# Patient Record
Sex: Female | Born: 1935 | Race: White | Hispanic: No | Smoking: Former smoker
Health system: Southern US, Community
[De-identification: ages and names within clinical notes are randomized; demographics above are authoritative.]

## PROBLEM LIST (undated history)

## (undated) DIAGNOSIS — E7800 Pure hypercholesterolemia, unspecified: Secondary | ICD-10-CM

## (undated) DIAGNOSIS — M545 Low back pain, unspecified: Secondary | ICD-10-CM

## (undated) DIAGNOSIS — H3530 Unspecified macular degeneration: Secondary | ICD-10-CM

## (undated) DIAGNOSIS — Z8719 Personal history of other diseases of the digestive system: Secondary | ICD-10-CM

## (undated) DIAGNOSIS — M4800 Spinal stenosis, site unspecified: Secondary | ICD-10-CM

## (undated) DIAGNOSIS — I10 Essential (primary) hypertension: Secondary | ICD-10-CM

## (undated) DIAGNOSIS — M1990 Unspecified osteoarthritis, unspecified site: Secondary | ICD-10-CM

## (undated) DIAGNOSIS — C4490 Unspecified malignant neoplasm of skin, unspecified: Secondary | ICD-10-CM

## (undated) DIAGNOSIS — R011 Cardiac murmur, unspecified: Secondary | ICD-10-CM

## (undated) DIAGNOSIS — J189 Pneumonia, unspecified organism: Secondary | ICD-10-CM

## (undated) DIAGNOSIS — G8929 Other chronic pain: Secondary | ICD-10-CM

## (undated) DIAGNOSIS — E039 Hypothyroidism, unspecified: Secondary | ICD-10-CM

## (undated) HISTORY — PX: FRACTURE SURGERY: SHX138

## (undated) HISTORY — PX: LUMBAR SPINE SURGERY: SHX701

## (undated) HISTORY — PX: ABDOMINAL HYSTERECTOMY: SHX81

## (undated) HISTORY — PX: LAPAROSCOPIC CHOLECYSTECTOMY: SUR755

## (undated) HISTORY — PX: APPENDECTOMY: SHX54

## (undated) HISTORY — PX: CATARACT EXTRACTION W/ INTRAOCULAR LENS  IMPLANT, BILATERAL: SHX1307

## (undated) HISTORY — PX: ROTATOR CUFF REPAIR: SHX139

## (undated) HISTORY — PX: BACK SURGERY: SHX140

---

## 2016-03-05 ENCOUNTER — Encounter (HOSPITAL_COMMUNITY): Payer: Self-pay

## 2016-03-05 ENCOUNTER — Inpatient Hospital Stay (HOSPITAL_COMMUNITY)
Admission: EM | Admit: 2016-03-05 | Discharge: 2016-03-07 | DRG: 202 | Disposition: A | Discharge status: Home / Self Care | Payer: Medicare Other | Attending: Internal Medicine | Admitting: Internal Medicine

## 2016-03-05 ENCOUNTER — Emergency Department (HOSPITAL_COMMUNITY): Payer: Medicare Other

## 2016-03-05 DIAGNOSIS — J210 Acute bronchiolitis due to respiratory syncytial virus: Secondary | ICD-10-CM | POA: Diagnosis not present

## 2016-03-05 DIAGNOSIS — G8929 Other chronic pain: Secondary | ICD-10-CM | POA: Diagnosis present

## 2016-03-05 DIAGNOSIS — E039 Hypothyroidism, unspecified: Secondary | ICD-10-CM | POA: Diagnosis present

## 2016-03-05 DIAGNOSIS — N179 Acute kidney failure, unspecified: Secondary | ICD-10-CM | POA: Diagnosis present

## 2016-03-05 DIAGNOSIS — M4800 Spinal stenosis, site unspecified: Secondary | ICD-10-CM | POA: Diagnosis present

## 2016-03-05 DIAGNOSIS — I10 Essential (primary) hypertension: Secondary | ICD-10-CM | POA: Diagnosis present

## 2016-03-05 DIAGNOSIS — J189 Pneumonia, unspecified organism: Secondary | ICD-10-CM

## 2016-03-05 DIAGNOSIS — H3530 Unspecified macular degeneration: Secondary | ICD-10-CM | POA: Diagnosis present

## 2016-03-05 DIAGNOSIS — D649 Anemia, unspecified: Secondary | ICD-10-CM | POA: Diagnosis present

## 2016-03-05 DIAGNOSIS — R778 Other specified abnormalities of plasma proteins: Secondary | ICD-10-CM | POA: Diagnosis present

## 2016-03-05 DIAGNOSIS — J181 Lobar pneumonia, unspecified organism: Secondary | ICD-10-CM

## 2016-03-05 DIAGNOSIS — E872 Acidosis: Secondary | ICD-10-CM | POA: Diagnosis present

## 2016-03-05 DIAGNOSIS — E785 Hyperlipidemia, unspecified: Secondary | ICD-10-CM | POA: Diagnosis present

## 2016-03-05 DIAGNOSIS — M545 Low back pain: Secondary | ICD-10-CM | POA: Diagnosis present

## 2016-03-05 HISTORY — DX: Unspecified osteoarthritis, unspecified site: M19.90

## 2016-03-05 HISTORY — DX: Hypothyroidism, unspecified: E03.9

## 2016-03-05 HISTORY — DX: Pneumonia, unspecified organism: J18.9

## 2016-03-05 HISTORY — DX: Cardiac murmur, unspecified: R01.1

## 2016-03-05 HISTORY — DX: Essential (primary) hypertension: I10

## 2016-03-05 HISTORY — DX: Personal history of other diseases of the digestive system: Z87.19

## 2016-03-05 HISTORY — DX: Low back pain: M54.5

## 2016-03-05 HISTORY — DX: Spinal stenosis, site unspecified: M48.00

## 2016-03-05 HISTORY — DX: Unspecified malignant neoplasm of skin, unspecified: C44.90

## 2016-03-05 HISTORY — DX: Pure hypercholesterolemia, unspecified: E78.00

## 2016-03-05 HISTORY — DX: Other chronic pain: G89.29

## 2016-03-05 HISTORY — DX: Low back pain, unspecified: M54.50

## 2016-03-05 HISTORY — DX: Unspecified macular degeneration: H35.30

## 2016-03-05 LAB — COMPREHENSIVE METABOLIC PANEL
ALT: 17 U/L (ref 14–54)
AST: 25 U/L (ref 15–41)
Albumin: 4 g/dL (ref 3.5–5.0)
Alkaline Phosphatase: 58 U/L (ref 38–126)
Anion gap: 11 (ref 5–15)
BUN: 15 mg/dL (ref 6–20)
CHLORIDE: 102 mmol/L (ref 101–111)
CO2: 26 mmol/L (ref 22–32)
CREATININE: 1.12 mg/dL — AB (ref 0.44–1.00)
Calcium: 9.2 mg/dL (ref 8.9–10.3)
GFR calc non Af Amer: 40 mL/min — ABNORMAL LOW (ref >60)
GFR, EST AFRICAN AMERICAN: 47 mL/min — AB (ref >60)
Glucose, Bld: 133 mg/dL — ABNORMAL HIGH (ref 65–99)
POTASSIUM: 4 mmol/L (ref 3.5–5.1)
SODIUM: 139 mmol/L (ref 135–145)
Total Bilirubin: 0.9 mg/dL (ref 0.3–1.2)
Total Protein: 8 g/dL (ref 6.5–8.1)

## 2016-03-05 LAB — CBC WITH DIFFERENTIAL/PLATELET
Basophils Absolute: 0 10*3/uL (ref 0.0–0.1)
Basophils Relative: 0 %
EOS ABS: 0 10*3/uL (ref 0.0–0.7)
Eosinophils Relative: 0 %
HCT: 40.5 % (ref 36.0–46.0)
HEMOGLOBIN: 13.7 g/dL (ref 12.0–15.0)
LYMPHS ABS: 0.8 10*3/uL (ref 0.7–4.0)
LYMPHS PCT: 10 %
MCH: 31.4 pg (ref 26.0–34.0)
MCHC: 33.8 g/dL (ref 30.0–36.0)
MCV: 92.7 fL (ref 78.0–100.0)
MONOS PCT: 9 %
Monocytes Absolute: 0.7 10*3/uL (ref 0.1–1.0)
NEUTROS PCT: 81 %
Neutro Abs: 6.8 10*3/uL (ref 1.7–7.7)
Platelets: 231 10*3/uL (ref 150–400)
RBC: 4.37 MIL/uL (ref 3.87–5.11)
RDW: 14.1 % (ref 11.5–15.5)
WBC: 8.4 10*3/uL (ref 4.0–10.5)

## 2016-03-05 LAB — URINALYSIS, ROUTINE W REFLEX MICROSCOPIC
BILIRUBIN URINE: NEGATIVE
GLUCOSE, UA: NEGATIVE mg/dL
HGB URINE DIPSTICK: NEGATIVE
Ketones, ur: NEGATIVE mg/dL
Leukocytes, UA: NEGATIVE
Nitrite: NEGATIVE
PH: 7 (ref 5.0–8.0)
Protein, ur: NEGATIVE mg/dL
SPECIFIC GRAVITY, URINE: 1.016 (ref 1.005–1.030)

## 2016-03-05 LAB — LACTIC ACID, PLASMA
LACTIC ACID, VENOUS: 2.3 mmol/L — AB (ref 0.5–1.9)
LACTIC ACID, VENOUS: 1.4 mmol/L (ref 0.5–1.9)

## 2016-03-05 LAB — I-STAT TROPONIN, ED: TROPONIN I, POC: 0 ng/mL (ref 0.00–0.08)

## 2016-03-05 LAB — PROCALCITONIN

## 2016-03-05 LAB — STREP PNEUMONIAE URINARY ANTIGEN: Strep Pneumo Urinary Antigen: NEGATIVE

## 2016-03-05 LAB — I-STAT CG4 LACTIC ACID, ED: Lactic Acid, Venous: 2.06 mmol/L (ref 0.5–1.9)

## 2016-03-05 LAB — TROPONIN I: Troponin I: 0.05 ng/mL (ref <0.03)

## 2016-03-05 LAB — INFLUENZA PANEL BY PCR (TYPE A & B)
INFLBPCR: NEGATIVE
Influenza A By PCR: NEGATIVE

## 2016-03-05 LAB — CG4 I-STAT (LACTIC ACID): LACTIC ACID, VENOUS: 1.87 mmol/L (ref 0.5–1.9)

## 2016-03-05 MED ORDER — DEXTROSE 5 % IV SOLN
1.0000 g | INTRAVENOUS | Status: DC
  Administered 2016-03-05 – 2016-03-06 (×2): 1 g via INTRAVENOUS
  Filled 2016-03-05 (×2): qty 10

## 2016-03-05 MED ORDER — ENOXAPARIN SODIUM 40 MG/0.4ML ~~LOC~~ SOLN
40.0000 mg | SUBCUTANEOUS | Status: DC
  Administered 2016-03-05 – 2016-03-06 (×2): 40 mg via SUBCUTANEOUS
  Filled 2016-03-05 (×2): qty 0.4

## 2016-03-05 MED ORDER — ALBUTEROL SULFATE (2.5 MG/3ML) 0.083% IN NEBU: 2.5000 mg | INHALATION_SOLUTION | RESPIRATORY_TRACT | Status: DC | PRN

## 2016-03-05 MED ORDER — SODIUM CHLORIDE 0.9 % IV BOLUS (SEPSIS)
500.0000 mL | Freq: Once | INTRAVENOUS | Status: AC
  Administered 2016-03-05: 500 mL via INTRAVENOUS

## 2016-03-05 MED ORDER — HYDRALAZINE HCL 20 MG/ML IJ SOLN: 10.0000 mg | Freq: Three times a day (TID) | INTRAMUSCULAR | Status: DC | PRN

## 2016-03-05 MED ORDER — PIPERACILLIN-TAZOBACTAM 3.375 G IVPB 30 MIN
3.3750 g | Freq: Once | INTRAVENOUS | Status: AC
  Administered 2016-03-05: 3.375 g via INTRAVENOUS
  Filled 2016-03-05: qty 50

## 2016-03-05 MED ORDER — METOPROLOL TARTRATE 25 MG PO TABS
25.0000 mg | ORAL_TABLET | Freq: Three times a day (TID) | ORAL | Status: DC
  Administered 2016-03-05 – 2016-03-07 (×5): 25 mg via ORAL
  Filled 2016-03-05 (×6): qty 1

## 2016-03-05 MED ORDER — SODIUM CHLORIDE 0.9 % IV SOLN
INTRAVENOUS | Status: DC
  Administered 2016-03-05 – 2016-03-06 (×2): via INTRAVENOUS

## 2016-03-05 MED ORDER — WHITE PETROLATUM GEL
Status: AC
  Filled 2016-03-05: qty 1

## 2016-03-05 MED ORDER — ACETAMINOPHEN 325 MG PO TABS
650.0000 mg | ORAL_TABLET | ORAL | Status: DC | PRN
  Administered 2016-03-05 – 2016-03-06 (×4): 650 mg via ORAL
  Filled 2016-03-05 (×4): qty 2

## 2016-03-05 MED ORDER — LEVOTHYROXINE SODIUM 50 MCG PO TABS
50.0000 ug | ORAL_TABLET | Freq: Every day | ORAL | Status: DC
  Administered 2016-03-06 – 2016-03-07 (×2): 50 ug via ORAL
  Filled 2016-03-05 (×2): qty 1

## 2016-03-05 MED ORDER — DEXTROSE 5 % IV SOLN
500.0000 mg | INTRAVENOUS | Status: DC
  Administered 2016-03-05 – 2016-03-06 (×2): 500 mg via INTRAVENOUS
  Filled 2016-03-05 (×2): qty 500

## 2016-03-05 MED ORDER — ONDANSETRON HCL 4 MG/2ML IJ SOLN
4.0000 mg | Freq: Four times a day (QID) | INTRAMUSCULAR | Status: DC | PRN
  Administered 2016-03-05: 4 mg via INTRAVENOUS
  Filled 2016-03-05: qty 2

## 2016-03-05 MED ORDER — IPRATROPIUM-ALBUTEROL 0.5-2.5 (3) MG/3ML IN SOLN
3.0000 mL | Freq: Three times a day (TID) | RESPIRATORY_TRACT | Status: DC
  Administered 2016-03-05 – 2016-03-06 (×4): 3 mL via RESPIRATORY_TRACT
  Filled 2016-03-05 (×4): qty 3

## 2016-03-05 MED ORDER — VANCOMYCIN HCL IN DEXTROSE 1-5 GM/200ML-% IV SOLN
1000.0000 mg | Freq: Once | INTRAVENOUS | Status: AC
  Administered 2016-03-05: 1000 mg via INTRAVENOUS
  Filled 2016-03-05: qty 200

## 2016-03-05 MED ORDER — PRAVASTATIN SODIUM 10 MG PO TABS
10.0000 mg | ORAL_TABLET | Freq: Every day | ORAL | Status: DC
  Administered 2016-03-05 – 2016-03-06 (×2): 10 mg via ORAL
  Filled 2016-03-05 (×2): qty 1

## 2016-03-05 MED ORDER — GUAIFENESIN ER 600 MG PO TB12: 600.0000 mg | ORAL_TABLET | Freq: Two times a day (BID) | ORAL | Status: DC | PRN

## 2016-03-05 MED ORDER — SODIUM CHLORIDE 0.9 % IV SOLN
Freq: Once | INTRAVENOUS | Status: AC
  Administered 2016-03-05: 17:00:00 via INTRAVENOUS

## 2016-03-05 MED ORDER — ASPIRIN EC 81 MG PO TBEC
81.0000 mg | DELAYED_RELEASE_TABLET | Freq: Every day | ORAL | Status: DC
  Administered 2016-03-05 – 2016-03-07 (×3): 81 mg via ORAL
  Filled 2016-03-05 (×3): qty 1

## 2016-03-05 NOTE — ED Provider Notes (Signed)
[facility] DEPT Provider Note   CSN: [id number] Arrival date & time: 03/05/16  [id number]     History   Chief Complaint Chief Complaint  Patient presents with  . Urinary Tract Infection    HPI Erika Hubbard is a 82 y.o. female.  HPI Patient presents with 3 days of reactive cough and increasing confusion. Denies any shortness of breath or chest pain. Questionable subjective fevers and chills as well as dysuria. Patient has chronic low back pain which is unchanged. She is visiting from [place] and here with daughter. Past Medical History:  Diagnosis Date  . Arthritis   . Macular degeneration   . Spinal stenosis     Patient Active Problem List   Diagnosis Date Noted  . CAP (community acquired pneumonia) 03/05/2016    Past Surgical History:  Procedure Laterality Date  . ABDOMINAL HYSTERECTOMY    . APPENDECTOMY    . BACK SURGERY    . CHOLECYSTECTOMY      OB History    No data available       Home Medications    Prior to Admission medications   Not on File    Family History No family history on file.  Social History Social History  Substance Use Topics  . Smoking status: Never Smoker  . Smokeless tobacco: Never Used  . Alcohol use No     Allergies   Patient has no allergy information on record.   Review of Systems Review of Systems  Constitutional: Positive for fatigue and fever.  HENT: Positive for congestion and rhinorrhea. Negative for sore throat.   Respiratory: Positive for cough. Negative for shortness of breath and wheezing.   Cardiovascular: Negative for chest pain, palpitations and leg swelling.  Gastrointestinal: Negative for abdominal pain, diarrhea, nausea and vomiting.  Genitourinary: Positive for dysuria. Negative for flank pain, frequency and hematuria.  Musculoskeletal: Positive for back pain. Negative for myalgias, neck pain and neck stiffness.  Skin: Negative for rash and wound.  Neurological: Negative for dizziness,  weakness, light-headedness, numbness and headaches.  Psychiatric/Behavioral: Positive for confusion.  All other systems reviewed and are negative.    Physical Exam Updated Vital Signs BP 154/74   Pulse 111   Resp 18   Ht 5\' 4"  (1.626 m)   Wt 148 lb (67.1 kg)   SpO2 95%   BMI 25.40 kg/m   Physical Exam  Constitutional: She is oriented to person, place, and time. She appears well-developed and well-nourished. No distress.  HENT:  Head: Normocephalic and atraumatic.  Mouth/Throat: Oropharynx is clear and moist. No oropharyngeal exudate.  Eyes: EOM are normal. Pupils are equal, round, and reactive to light.  Neck: Normal range of motion. Neck supple. No JVD present.  Cardiovascular: Regular rhythm.   Tachycardia  Pulmonary/Chest: Effort normal and breath sounds normal. No respiratory distress. She has no wheezes. She has no rales. She exhibits no tenderness.  Abdominal: Soft. Bowel sounds are normal. There is no tenderness. There is no rebound and no guarding.  Musculoskeletal: Normal range of motion. She exhibits no edema or tenderness.  Mild lumbar midline tenderness to palpation. No CVA tenderness. No lower extremity swelling, asymmetry or tenderness.  Neurological: She is alert and oriented to person, place, and time.  Speaks in a clear voice. Oriented 4. 5/5 motor in all extremities. Sensation is grossly intact.  Skin: Skin is warm and dry. No rash noted. She is not diaphoretic. No erythema.  Psychiatric: She has a normal mood and affect. Her behavior is  normal.  Nursing note and vitals reviewed.    ED Treatments / Results  Labs (all labs ordered are listed, but only abnormal results are displayed) Labs Reviewed  COMPREHENSIVE METABOLIC PANEL - Abnormal; Notable for the following:       Result Value   Glucose, Bld 133 (*)    Creatinine, Ser 1.12 (*)    GFR calc non Af Amer 40 (*)    GFR calc Af Amer 47 (*)    All other components within normal limits  URINALYSIS,  ROUTINE W REFLEX MICROSCOPIC - Abnormal; Notable for the following:    APPearance HAZY (*)    All other components within normal limits  I-STAT CG4 LACTIC ACID, ED - Abnormal; Notable for the following:    Lactic Acid, Venous 2.06 (*)    All other components within normal limits  CULTURE, BLOOD (ROUTINE X 2)  CULTURE, BLOOD (ROUTINE X 2)  URINE CULTURE  CBC WITH DIFFERENTIAL/PLATELET  INFLUENZA PANEL BY PCR (TYPE A & B, H1N1)  I-STAT TROPOININ, ED  I-STAT CG4 LACTIC ACID, ED    EKG  EKG Interpretation  Date/Time:  Monday March 05 2016 10:07:09 EST Ventricular Rate:  118 PR Interval:    QRS Duration: 80 QT Interval:  318 QTC Calculation: 446 R Axis:   24 Text Interpretation:  Sinus tachycardia Consider left ventricular hypertrophy Confirmed by Lita Mains  MD, Ra Pfiester ([id number]) on 03/05/2016 10:18:07 AM       Radiology Dg Chest Port 1 View  Result Date: 03/05/2016 CLINICAL DATA:  Three day history of cough with right chest tightness, pain and congestion. EXAM: PORTABLE CHEST 1 VIEW COMPARISON:  None. FINDINGS: The lungs are adequately inflated with minimal patchy density right base likely atelectasis. Cardiomediastinal silhouette is within normal. There is minimal calcified plaque over the aortic arch. Surgical anchor over the left humeral head. IMPRESSION: Minimal patchy right base density likely atelectasis. Electronically Signed   By: Marin Olp M.D.   On: 03/05/2016 10:36    Procedures Procedures (including critical care time)  Medications Ordered in ED Medications  acetaminophen (TYLENOL) tablet 650 mg (650 mg Oral Given 03/05/16 1107)  sodium chloride 0.9 % bolus 500 mL (0 mLs Intravenous Stopped 03/05/16 1245)  vancomycin (VANCOCIN) IVPB 1000 mg/200 mL premix (0 mg Intravenous Stopped 03/05/16 1246)  piperacillin-tazobactam (ZOSYN) IVPB 3.375 g (0 g Intravenous Stopped 03/05/16 1224)     Initial Impression / Assessment and Plan / ED Course  I have reviewed the  triage vital signs and the nursing notes.  Pertinent labs & imaging results that were available during my care of the patient were reviewed by me and considered in my medical decision making (see chart for details).  Clinical Course   Right basilar infiltrate. Given productive cough and fever we'll treat for community acquired pneumonia. Discussed with hospitalist and we'll admit for observation.   Final Clinical Impressions(s) / ED Diagnoses   Final diagnoses:  Community acquired pneumonia of right lower lobe of lung [facility])    New Prescriptions New Prescriptions   No medications on file     Julianne Rice, MD 03/05/16 1314

## 2016-03-05 NOTE — Progress Notes (Signed)
3rd lactic acid level of 2.3.  PA on call notified.

## 2016-03-05 NOTE — ED Triage Notes (Signed)
For 3 days she has been complaining of a cough, rhonchi with EMS and O2 sats at 91 on RA with a cough. Burning when urinating and sts she may have a UTI. Family noted some confusion and some mild aphasia. No hy of dementia and afebrile

## 2016-03-05 NOTE — ED Notes (Signed)
Per family request, they want to make sure the patient doesn't have an allergy to tylenol and they are calling family to confirm

## 2016-03-05 NOTE — Progress Notes (Signed)
Pharmacy Antibiotic Note  Erika Hubbard is a 82 y.o. female admitted on 03/05/2016 with pneumonia.  Pharmacy has been consulted for Ceftriaxone dosing.  Plan: Ceftriaxone 1 gram iv Q 24 hours Pharmacy to sign off as does not require renal dosing  Height: 5\' 4"  (162.6 cm) Weight: 148 lb (67.1 kg) IBW/kg (Calculated) : 54.7  No data recorded.   Recent Labs Lab 03/05/16 1034 03/05/16 1053  WBC 8.4  --   CREATININE 1.12*  --   LATICACIDVEN  --  2.06*    Estimated Creatinine Clearance: 28.3 mL/min (by C-G formula based on SCr of 1.12 mg/dL (H)).    Not on File   Thank you for allowing pharmacy to be a part of this patient's care.  Tad Moore 03/05/2016 1:48 PM

## 2016-03-05 NOTE — H&P (Signed)
History and Physical    Erika Hubbard [id number] DOB: [birth year] DOA: 03/05/2016   PCP: No primary care provider on file.   Patient coming from:  Home   Chief Complaint: Cough  HPI: Erika Hubbard is a 82 y.o. female with medical history significant for HTN, HLD, among other medical issues, brought by herfamily due to upper respiratory symptoms. In review, she arrived from [place] one week ago to  Visit her family for Christmas when she began to experience increasing cough without significant shortness of breath obver the last 3 days. She reports being exposed to her grandchildren and her grandson who were "fighting a cold".  Denies rhinorrhea or hemoptysis. Reports subjective fevers, chills, without night sweats or mucositis. Denies any chest pain, chest wall pain or palpitations.Denies any abdominal pain. Appetite is normal, and denies nausea or  vomiting. Denies dizziness or vertigo. Denies acute  lower extremity swelling or calf pain -she has chronic issues with peripheral edema and takes diuretics for this. No confusion was reported. Denies any  headaches. No falls   ED Course:  BP (!) 160/72 (BP Location: Right Arm)   Pulse (!) 113   Temp 99.5 F (37.5 C) (Oral)   Resp (!) 22   Ht 5' 4"  (1.626 m)   Wt 67.1 kg (148 lb)   SpO2 93%   BMI 25.40 kg/m    NA 139, K 4.0  Cr 1.12 EGFR 40  Tn 0 LA 2.06->1.87 WBC 8.4 , Hb 13.7, Platelets 237  Glu 133  CXR  UA  Hazy but negative fpr nitrites or leukocytes   CXR Minimal patchy right base density  EKG Sinus tach   Review of Systems: As per HPI otherwise 10 point review of systems negative.   Past Medical History:  Diagnosis Date  . Arthritis   . Macular degeneration   . Spinal stenosis     Past Surgical History:  Procedure Laterality Date  . ABDOMINAL HYSTERECTOMY    . APPENDECTOMY    . BACK SURGERY    . CHOLECYSTECTOMY      Social History Social History   Social History  . Marital status: Unknown    Spouse  name: N/A  . Number of children: N/A  . Years of education: N/A   Occupational History  . Not on file.   Social History Main Topics  . Smoking status: Never Smoker  . Smokeless tobacco: Never Used  . Alcohol use No  . Drug use: No  . Sexual activity: Not on file   Other Topics Concern  . Not on file   Social History Narrative  . No narrative on file     Allergies  Allergen Reactions  . Morphine And Related Nausea And Vomiting    History reviewed. No pertinent family history.    Prior to Admission medications   Not on File    Physical Exam:    Vitals:   03/05/16 1300 03/05/16 1330 03/05/16 1400 03/05/16 1432  BP: 150/70 128/68 134/70 (!) 160/72  Pulse: 109 106 103 (!) 113  Resp: 16 24 22  (!) 22  Temp:    99.5 F (37.5 C)  TempSrc:    Oral  SpO2: 94% 95% 93% 93%  Weight:      Height:           Constitutional: NAD, calm, comfortable   Vitals:   03/05/16 1300 03/05/16 1330 03/05/16 1400 03/05/16 1432  BP: 150/70 128/68 134/70 (!) 160/72  Pulse: 109 106 103 (!) 113  Resp: 16 24 22  (!) 22  Temp:    99.5 F (37.5 C)  TempSrc:    Oral  SpO2: 94% 95% 93% 93%  Weight:      Height:       Eyes: PERRL, lids and conjunctivae normal ENMT: Mucous membranes are moist. Posterior pharynx clear of any exudate or lesions.Normal dentition.  Neck: normal, supple, no masses, no thyromegaly Respiratory:  Essentially clear to auscultation bilaterally except for minimal rhonchi at the right base  , no wheezing, no crackles. Normal respiratory effort. No accessory muscle use.  Cardiovascular: tachy with regular  rhythm, no murmurs / rubs / gallops. No extremity edema. 2+ pedal pulses. No carotid bruits.  Abdomen: no tenderness, no masses palpated. No hepatosplenomegaly. Bowel sounds positive.  Musculoskeletal: no clubbing / cyanosis. No joint deformity upper and lower extremities. Good ROM, no contractures. Normal muscle tone.  Skin: no rashes, lesions, ulcers.    Neurologic: CN 2-12 grossly intact. Sensation intact, DTR normal. Strength 5/5 in all 4.  Psychiatric: Normal judgment and insight. Alert and oriented x 3. Normal mood.     Labs on Admission: I have personally reviewed following labs and imaging studies  CBC:  Recent Labs Lab 03/05/16 1034  WBC 8.4  NEUTROABS 6.8  HGB 13.7  HCT 40.5  MCV 92.7  PLT 270    Basic Metabolic Panel:  Recent Labs Lab 03/05/16 1034  NA 139  K 4.0  CL 102  CO2 26  GLUCOSE 133*  BUN 15  CREATININE 1.12*  CALCIUM 9.2    GFR: Estimated Creatinine Clearance: 28.3 mL/min (by C-G formula based on SCr of 1.12 mg/dL (H)).  Liver Function Tests:  Recent Labs Lab 03/05/16 1034  AST 25  ALT 17  ALKPHOS 58  BILITOT 0.9  PROT 8.0  ALBUMIN 4.0   No results for input(s): LIPASE, AMYLASE in the last 168 hours. No results for input(s): AMMONIA in the last 168 hours.  Coagulation Profile: No results for input(s): INR, PROTIME in the last 168 hours.  Cardiac Enzymes: No results for input(s): CKTOTAL, CKMB, CKMBINDEX, TROPONINI in the last 168 hours.  BNP (last 3 results) No results for input(s): PROBNP in the last 8760 hours.  HbA1C: No results for input(s): HGBA1C in the last 72 hours.  CBG: No results for input(s): GLUCAP in the last 168 hours.  Lipid Profile: No results for input(s): CHOL, HDL, LDLCALC, TRIG, CHOLHDL, LDLDIRECT in the last 72 hours.  Thyroid Function Tests: No results for input(s): TSH, T4TOTAL, FREET4, T3FREE, THYROIDAB in the last 72 hours.  Anemia Panel: No results for input(s): VITAMINB12, FOLATE, FERRITIN, TIBC, IRON, RETICCTPCT in the last 72 hours.  Urine analysis:    Component Value Date/Time   COLORURINE YELLOW 03/05/2016 1242   APPEARANCEUR HAZY (A) 03/05/2016 1242   LABSPEC 1.016 03/05/2016 1242   PHURINE 7.0 03/05/2016 1242   GLUCOSEU NEGATIVE 03/05/2016 1242   HGBUR NEGATIVE 03/05/2016 1242   BILIRUBINUR NEGATIVE 03/05/2016 1242   KETONESUR  NEGATIVE 03/05/2016 1242   PROTEINUR NEGATIVE 03/05/2016 1242   NITRITE NEGATIVE 03/05/2016 1242   LEUKOCYTESUR NEGATIVE 03/05/2016 1242    Sepsis Labs: @LABRCNTIP (procalcitonin:4,lacticidven:4) )No results found for this or any previous visit (from the past 240 hour(s)).   Radiological Exams on Admission: Dg Chest Port 1 View  Result Date: 03/05/2016 CLINICAL DATA:  Three day history of cough with right chest tightness, pain and congestion. EXAM: PORTABLE CHEST 1 VIEW COMPARISON:  None. FINDINGS: The lungs are adequately inflated with  minimal patchy density right base likely atelectasis. Cardiomediastinal silhouette is within normal. There is minimal calcified plaque over the aortic arch. Surgical anchor over the left humeral head. IMPRESSION: Minimal patchy right base density likely atelectasis. Electronically Signed   By: Marin Olp M.D.   On: 03/05/2016 10:36    EKG: Independently reviewed.  Assessment/Plan Active Problems:   CAP (community acquired pneumonia)   Hypertension   Hyperlipidemia   Hypothyroidism   Presumed CAP Presenting now with ongoing / progressive symptoms including  cough, fever up to 101 . CXR today suggests minimal patchy infiltrate on the right base   WBC 8.4  Lactic acid 2.06-> 1.87 . O2 92-94 RA . Received Vanco Zosyn and IV bolus 500 cc Flu panel pending  Admit to Medsurg obs  Oxygen  Prn  Sputum cultures  IV antibiotics with per PNA order set protocol, including Ceftriaxone and Zithromax per Pharmacy IV  IVF at 120 cc/h for 18 hrs  Procalcitonin,Strep pneumo urine antigen, Legionella urine antigen Mucinex prn  IV fluids antipyretics Repeat CBC in am   Hypertension BP 150/70   Pulse 109   Continue home anti-hypertensive medications    Hyperlipidemia Continue home statins  Hypothyroidism: -Continue home Synthroid   DVT prophylaxis: Lovenox  Code Status:   Full      Family Communication:  Discussed with patient and family    Disposition Plan: Expect patient to be discharged to home after condition improves Consults called:    None Admission status:  Obs  Medsurg    [place], PA-C [facility]   03/05/2016, 5:04 PM

## 2016-03-06 ENCOUNTER — Encounter (HOSPITAL_COMMUNITY): Payer: Self-pay | Admitting: General Practice

## 2016-03-06 ENCOUNTER — Observation Stay (HOSPITAL_COMMUNITY): Payer: Medicare Other

## 2016-03-06 DIAGNOSIS — J210 Acute bronchiolitis due to respiratory syncytial virus: Secondary | ICD-10-CM | POA: Diagnosis present

## 2016-03-06 DIAGNOSIS — A419 Sepsis, unspecified organism: Secondary | ICD-10-CM

## 2016-03-06 DIAGNOSIS — I10 Essential (primary) hypertension: Secondary | ICD-10-CM

## 2016-03-06 DIAGNOSIS — R778 Other specified abnormalities of plasma proteins: Secondary | ICD-10-CM | POA: Diagnosis present

## 2016-03-06 DIAGNOSIS — G8929 Other chronic pain: Secondary | ICD-10-CM | POA: Diagnosis present

## 2016-03-06 DIAGNOSIS — E034 Atrophy of thyroid (acquired): Secondary | ICD-10-CM | POA: Diagnosis not present

## 2016-03-06 DIAGNOSIS — J189 Pneumonia, unspecified organism: Secondary | ICD-10-CM | POA: Diagnosis present

## 2016-03-06 DIAGNOSIS — E785 Hyperlipidemia, unspecified: Secondary | ICD-10-CM | POA: Diagnosis present

## 2016-03-06 DIAGNOSIS — J181 Lobar pneumonia, unspecified organism: Secondary | ICD-10-CM

## 2016-03-06 DIAGNOSIS — H3530 Unspecified macular degeneration: Secondary | ICD-10-CM | POA: Diagnosis present

## 2016-03-06 DIAGNOSIS — D649 Anemia, unspecified: Secondary | ICD-10-CM | POA: Diagnosis present

## 2016-03-06 DIAGNOSIS — E872 Acidosis: Secondary | ICD-10-CM | POA: Diagnosis present

## 2016-03-06 DIAGNOSIS — N179 Acute kidney failure, unspecified: Secondary | ICD-10-CM | POA: Diagnosis present

## 2016-03-06 DIAGNOSIS — E784 Other hyperlipidemia: Secondary | ICD-10-CM

## 2016-03-06 DIAGNOSIS — M545 Low back pain: Secondary | ICD-10-CM | POA: Diagnosis present

## 2016-03-06 DIAGNOSIS — M4800 Spinal stenosis, site unspecified: Secondary | ICD-10-CM | POA: Diagnosis present

## 2016-03-06 DIAGNOSIS — R6520 Severe sepsis without septic shock: Secondary | ICD-10-CM

## 2016-03-06 DIAGNOSIS — E039 Hypothyroidism, unspecified: Secondary | ICD-10-CM | POA: Diagnosis present

## 2016-03-06 LAB — COMPREHENSIVE METABOLIC PANEL
ALK PHOS: 39 U/L (ref 38–126)
ALT: 14 U/L (ref 14–54)
ANION GAP: 9 (ref 5–15)
AST: 24 U/L (ref 15–41)
Albumin: 2.8 g/dL — ABNORMAL LOW (ref 3.5–5.0)
BILIRUBIN TOTAL: 0.6 mg/dL (ref 0.3–1.2)
BUN: 11 mg/dL (ref 6–20)
CALCIUM: 7.6 mg/dL — AB (ref 8.9–10.3)
CO2: 20 mmol/L — ABNORMAL LOW (ref 22–32)
CREATININE: 1 mg/dL (ref 0.44–1.00)
Chloride: 107 mmol/L (ref 101–111)
GFR, EST AFRICAN AMERICAN: 54 mL/min — AB (ref >60)
GFR, EST NON AFRICAN AMERICAN: 46 mL/min — AB (ref >60)
Glucose, Bld: 120 mg/dL — ABNORMAL HIGH (ref 65–99)
Potassium: 3.6 mmol/L (ref 3.5–5.1)
Sodium: 136 mmol/L (ref 135–145)
TOTAL PROTEIN: 6 g/dL — AB (ref 6.5–8.1)

## 2016-03-06 LAB — RESPIRATORY PANEL BY PCR
ADENOVIRUS-RVPPCR: NOT DETECTED
Bordetella pertussis: NOT DETECTED
CHLAMYDOPHILA PNEUMONIAE-RVPPCR: NOT DETECTED
CORONAVIRUS 229E-RVPPCR: NOT DETECTED
CORONAVIRUS OC43-RVPPCR: NOT DETECTED
Coronavirus HKU1: NOT DETECTED
Coronavirus NL63: NOT DETECTED
INFLUENZA A-RVPPCR: NOT DETECTED
Influenza B: NOT DETECTED
MYCOPLASMA PNEUMONIAE-RVPPCR: NOT DETECTED
Metapneumovirus: NOT DETECTED
PARAINFLUENZA VIRUS 1-RVPPCR: NOT DETECTED
PARAINFLUENZA VIRUS 4-RVPPCR: NOT DETECTED
Parainfluenza Virus 2: NOT DETECTED
Parainfluenza Virus 3: NOT DETECTED
Respiratory Syncytial Virus: DETECTED — AB
Rhinovirus / Enterovirus: NOT DETECTED

## 2016-03-06 LAB — TROPONIN I: Troponin I: 0.08 ng/mL (ref <0.03)

## 2016-03-06 LAB — CBC
HCT: 32 % — ABNORMAL LOW (ref 36.0–46.0)
HEMOGLOBIN: 10.6 g/dL — AB (ref 12.0–15.0)
MCH: 30.7 pg (ref 26.0–34.0)
MCHC: 33.1 g/dL (ref 30.0–36.0)
MCV: 92.8 fL (ref 78.0–100.0)
Platelets: 191 10*3/uL (ref 150–400)
RBC: 3.45 MIL/uL — AB (ref 3.87–5.11)
RDW: 14.4 % (ref 11.5–15.5)
WBC: 6.5 10*3/uL (ref 4.0–10.5)

## 2016-03-06 LAB — URINE CULTURE

## 2016-03-06 LAB — INFLUENZA PANEL BY PCR (TYPE A & B)
INFLBPCR: NEGATIVE
Influenza A By PCR: NEGATIVE

## 2016-03-06 MED ORDER — OSELTAMIVIR PHOSPHATE 75 MG PO CAPS
75.0000 mg | ORAL_CAPSULE | Freq: Two times a day (BID) | ORAL | Status: DC
  Administered 2016-03-06: 75 mg via ORAL
  Filled 2016-03-06: qty 1

## 2016-03-06 MED ORDER — NAPROXEN SODIUM 220 MG PO TABS: 220.0000 mg | ORAL_TABLET | Freq: Two times a day (BID) | ORAL | Status: AC | PRN

## 2016-03-06 NOTE — Progress Notes (Addendum)
PROGRESS NOTE    Erika Hubbard  [id number] DOB: 1918/05/04 DOA: 03/05/2016  PCP: Pcp Not In System   Brief Narrative:  Erika Hubbard is a 82 y.o. female with medical history significant for HTN, HLD, among other medical issues, brought by hervfamily due to upper respiratory symptoms. She is from [facility] and arrive one week ago to fisit her family for Christmas when she began to experience increasing cough fever and chills over the last 3 days. She reports being exposed to other family members in [place] who were "fighting a cold". Found to have a fever of 101 and possible infiltrate in RLL.    Subjective: Does not feel much better yet. Has cough and continues to have fevers. No dyspnea.   Assessment & Plan:   Principal Problem:   CAP (community acquired pneumonia)- severe sepsis with elevated HR, RR, fever - possible CAP vs acute bronchitis- RLL infiltrate vs atelectasis- Rocephin and Zithromax - possibly viral as antibiotics are not helping resolve symptoms- still febrile - Influenza negative- f/u resp viral panel- follow for 24 hrs more in hospital Addendum: RSV + - cont supportive care- d/c antibiotics  Active Problems: Mild lactic acidosis - improved  Elevated Troponin - mild elevation of 0.05, 0.08-  flat trend, no chest pain- no further work up  AKI - Cr 1.12 on admission- improving  Anemia - mild- follow    Hypertension - Lopressor    Hyperlipidemia - Pravastatin    Hypothyroidism - Synthroid   DVT prophylaxis: lovenox Code Status: Full code Family Communication: daughter Disposition Plan: home when stable- RN to ambulate patient to day- Consultants:    Procedures:    Antimicrobials:  Anti-infectives    Start     Dose/Rate Route Frequency Ordered Stop   03/06/16 1215  oseltamivir (TAMIFLU) capsule 75 mg  Status:  Discontinued     75 mg Oral 2 times daily 03/06/16 1135 03/06/16 1558   03/05/16 1600  cefTRIAXone (ROCEPHIN) 1 g in  dextrose 5 % 50 mL IVPB     1 g 100 mL/hr over 30 Minutes Intravenous Every 24 hours 03/05/16 1348     03/05/16 1345  azithromycin (ZITHROMAX) 500 mg in dextrose 5 % 250 mL IVPB     500 mg 250 mL/hr over 60 Minutes Intravenous Every 24 hours 03/05/16 1340     03/05/16 1100  vancomycin (VANCOCIN) IVPB 1000 mg/200 mL premix     1,000 mg 200 mL/hr over 60 Minutes Intravenous  Once 03/05/16 1059 03/05/16 1246   03/05/16 1100  piperacillin-tazobactam (ZOSYN) IVPB 3.375 g     3.375 g 100 mL/hr over 30 Minutes Intravenous  Once 03/05/16 1059 03/05/16 1224       Objective: Vitals:   03/06/16 0501 03/06/16 1018 03/06/16 1449 03/06/16 1526  BP: (!) 136/50 (!) 133/51 (!) 117/42   Pulse:  86 64   Resp:      Temp:  (!) 101.4 F (38.6 C) 99.3 F (37.4 C)   TempSrc:  Oral Oral   SpO2:  90% 93% 93%  Weight:      Height:        Intake/Output Summary (Last 24 hours) at 03/06/16 1602 Last data filed at 03/06/16 1319  Gross per 24 hour  Intake          2763.67 ml  Output              300 ml  Net          2463.67 ml  Filed Weights   03/05/16 1003  Weight: 67.1 kg (148 lb)    Examination: General exam: Appears comfortable  HEENT: PERRLA, oral mucosa moist, no sclera icterus or thrush Respiratory system: coarse rhonchi b/l -- Respiratory effort normal. Cardiovascular system: S1 & S2 heard, RRR.  No murmurs  Gastrointestinal system: Abdomen soft, non-tender, nondistended. Normal bowel sound. No organomegaly Central nervous system: Alert and oriented. No focal neurological deficits. Extremities: No cyanosis, clubbing or edema Skin: No rashes or ulcers Psychiatry:  Mood & affect appropriate.     Data Reviewed: I have personally reviewed following labs and imaging studies  CBC:  Recent Labs Lab 03/05/16 1034 03/06/16 0016  WBC 8.4 6.5  NEUTROABS 6.8  --   HGB 13.7 10.6*  HCT 40.5 32.0*  MCV 92.7 92.8  PLT 231 [id number]   Basic Metabolic Panel:  Recent Labs Lab 03/05/16 1034  03/06/16 0016  NA 139 136  K 4.0 3.6  CL 102 107  CO2 26 20*  GLUCOSE 133* 120*  BUN 15 11  CREATININE 1.12* 1.00  CALCIUM 9.2 7.6*   GFR: Estimated Creatinine Clearance: 31.7 mL/min (by C-G formula based on SCr of 1 mg/dL). Liver Function Tests:  Recent Labs Lab 03/05/16 1034 03/06/16 0016  AST 25 24  ALT 17 14  ALKPHOS 58 39  BILITOT 0.9 0.6  PROT 8.0 6.0*  ALBUMIN 4.0 2.8*   No results for input(s): LIPASE, AMYLASE in the last 168 hours. No results for input(s): AMMONIA in the last 168 hours. Coagulation Profile: No results for input(s): INR, PROTIME in the last 168 hours. Cardiac Enzymes:  Recent Labs Lab 03/05/16 1759 03/06/16 0016  TROPONINI 0.05* 0.08*   BNP (last 3 results) No results for input(s): PROBNP in the last 8760 hours. HbA1C: No results for input(s): HGBA1C in the last 72 hours. CBG: No results for input(s): GLUCAP in the last 168 hours. Lipid Profile: No results for input(s): CHOL, HDL, LDLCALC, TRIG, CHOLHDL, LDLDIRECT in the last 72 hours. Thyroid Function Tests: No results for input(s): TSH, T4TOTAL, FREET4, T3FREE, THYROIDAB in the last 72 hours. Anemia Panel: No results for input(s): VITAMINB12, FOLATE, FERRITIN, TIBC, IRON, RETICCTPCT in the last 72 hours. Urine analysis:    Component Value Date/Time   COLORURINE YELLOW 03/05/2016 1242   APPEARANCEUR HAZY (A) 03/05/2016 1242   LABSPEC 1.016 03/05/2016 1242   PHURINE 7.0 03/05/2016 1242   GLUCOSEU NEGATIVE 03/05/2016 1242   HGBUR NEGATIVE 03/05/2016 1242   BILIRUBINUR NEGATIVE 03/05/2016 1242   KETONESUR NEGATIVE 03/05/2016 1242   PROTEINUR NEGATIVE 03/05/2016 1242   NITRITE NEGATIVE 03/05/2016 1242   LEUKOCYTESUR NEGATIVE 03/05/2016 1242   Sepsis Labs: @LABRCNTIP (procalcitonin:4,lacticidven:4) ) Recent Results (from the past 240 hour(s))  Blood Culture (routine x 2)     Status: None (Preliminary result)   Collection Time: 03/05/16 10:30 AM  Result Value Ref Range Status     Specimen Description BLOOD RIGHT ANTECUBITAL  Final   Special Requests   Final    BOTTLES DRAWN AEROBIC AND ANAEROBIC 10CC AER 5CC ANA   Culture NO GROWTH < 24 HOURS  Final   Report Status PENDING  Incomplete  Blood Culture (routine x 2)     Status: None (Preliminary result)   Collection Time: 03/05/16 10:50 AM  Result Value Ref Range Status   Specimen Description BLOOD LEFT ANTECUBITAL  Final   Special Requests BOTTLES DRAWN AEROBIC AND ANAEROBIC 5CC  Final   Culture NO GROWTH < 24 HOURS  Final   Report  Status PENDING  Incomplete  Urine culture     Status: Abnormal   Collection Time: 03/05/16 12:42 PM  Result Value Ref Range Status   Specimen Description URINE, RANDOM  Final   Special Requests NONE  Final   Culture <10,000 COLONIES/mL INSIGNIFICANT GROWTH (A)  Final   Report Status 03/06/2016 FINAL  Final         Radiology Studies: Dg Chest 2 View  Result Date: 03/06/2016 CLINICAL DATA:  Cough. EXAM: CHEST  2 VIEW COMPARISON:  03/05/2016. FINDINGS: Mediastinum and hilar structures are normal. Cardiomegaly with normal pulmonary vascularity. Right base subsegmental atelectasis. No pleural effusion or pneumothorax . IMPRESSION: 1. Cardiomegaly with normal pulmonary vascularity. 2.  Low lung volumes with mild right base subsegmental atelectasis. Electronically Signed   By: Marcello Moores  Register   On: 03/06/2016 07:54   Dg Chest Port 1 View  Result Date: 03/05/2016 CLINICAL DATA:  Three day history of cough with right chest tightness, pain and congestion. EXAM: PORTABLE CHEST 1 VIEW COMPARISON:  None. FINDINGS: The lungs are adequately inflated with minimal patchy density right base likely atelectasis. Cardiomediastinal silhouette is within normal. There is minimal calcified plaque over the aortic arch. Surgical anchor over the left humeral head. IMPRESSION: Minimal patchy right base density likely atelectasis. Electronically Signed   By: Marin Olp M.D.   On: 03/05/2016 10:36       Scheduled Meds: . aspirin EC  81 mg Oral Daily  . azithromycin  500 mg Intravenous Q24H  . cefTRIAXone (ROCEPHIN)  IV  1 g Intravenous Q24H  . enoxaparin (LOVENOX) injection  40 mg Subcutaneous Q24H  . ipratropium-albuterol  3 mL Nebulization Q8H  . levothyroxine  50 mcg Oral QAC breakfast  . metoprolol tartrate  25 mg Oral TID  . pravastatin  10 mg Oral QHS   Continuous Infusions:   LOS: 0 days    Time spent in minutes: 33    Abundio Teuscher, MD [facility] Pager: [email or web address] Password [email or web address] 03/06/2016, 4:02 PM

## 2016-03-06 NOTE — Progress Notes (Signed)
   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 Pt ok with ritual of anointing and offered prayer.

## 2016-03-06 NOTE — Progress Notes (Signed)
CRITICAL VALUE ALERT  Critical value received:  Positive RSV  Date of notification:  03/06/16  Time of notification:  [id number]  Critical value read back:Yes.    Nurse who received alert:  Cameron Sprang  MD notified (1st page):  Dr Wynelle Cleveland  Time of first page:  1640  MD notified (2nd page):  Time of second page:  Responding MD:  Dr Wynelle Cleveland  Time MD responded:  1640

## 2016-03-06 NOTE — Discharge Summary (Signed)
Physician Discharge Summary  Erika Hubbard [id number] DOB: 1918/03/20 DOA: 03/05/2016  PCP: Pcp Not In System  Admit date: 03/05/2016 Discharge date: 03/06/2016  Admitted From: home  Disposition:  home    Discharge Condition:  stable   CODE STATUS:  Full code   Diet recommendation:  Heart healthy Consultations:  none    Discharge Diagnoses:  Principal Problem:   Acute bronchiolitis due to respiratory syncytial virus (RSV) Active Problems:   Hypertension   Hyperlipidemia   Hypothyroidism    Subjective: Cough and congestion. No dyspnea.   Brief Summary: Erika Heverlingis a 82 y.o.femalewith medical history significant for HTN, HLD, among other medical issues, brought by her family due to upper respiratory symptoms. She is from [place] and arrive one week ago to fisit her family for Christmas when she began to experience increasing cough fever and chills over the last 3 days. She reports being exposed to other family members in [place] who were "fighting a cold". Found to have a fever of 101 and possible infiltrate in RLL.   Hospital Course:  Principal Problem: Severe sepsis with elevated HR, RR, fever- RSV - possible CAP vs acute bronchitis- RLL infiltrate vs atelectasis- started on Rocephin and Zithromax after being given Vanc an Zosyn in ER - Influenza negative-  - Rep viral panel later resulted >> RSV +,  cont supportive care- d/c antibiotics  Active Problems: Mild lactic acidosis - improved  Elevated Troponin - mild elevation of 0.05, 0.08-  flat trend, no chest pain- no further work up  AKI - Cr 1.12 on admission- improving  Anemia - mild- follow    Hypertension - Lopressor    Hyperlipidemia - Pravastatin    Hypothyroidism - Synthroid  Discharge Instructions  Discharge Instructions    Diet - low sodium heart healthy    Complete by:  As directed    Increase activity slowly    Complete by:  As directed      Allergies as  of 03/06/2016      Reactions   Morphine And Related Nausea And Vomiting      Medication List    TAKE these medications   aspirin EC 81 MG tablet Take 81 mg by mouth daily.   levothyroxine 50 MCG tablet Commonly known as:  SYNTHROID, LEVOTHROID Take 50 mcg by mouth daily before breakfast.   Magnesium 500 MG Tabs Take 500 mg by mouth daily at 12 noon.   metoprolol tartrate 25 MG tablet Commonly known as:  LOPRESSOR Take 25 mg by mouth 3 (three) times daily.   naproxen sodium 220 MG tablet Commonly known as:  ALEVE Take 1 tablet (220 mg total) by mouth 2 (two) times daily as needed (pain).   OCUVITE PO Take 1 capsule by mouth at bedtime.   pravastatin 10 MG tablet Commonly known as:  PRAVACHOL Take 10 mg by mouth at bedtime.   VIACTIV PO Take 1 tablet by mouth See admin instructions. Chew 1 tablet by mouth two times daily - at noon and at bedtime       Allergies  Allergen Reactions  . Morphine And Related Nausea And Vomiting     Procedures/Studies:   Dg Chest 2 View  Result Date: 03/06/2016 CLINICAL DATA:  Cough. EXAM: CHEST  2 VIEW COMPARISON:  03/05/2016. FINDINGS: Mediastinum and hilar structures are normal. Cardiomegaly with normal pulmonary vascularity. Right base subsegmental atelectasis. No pleural effusion or pneumothorax . IMPRESSION: 1. Cardiomegaly with normal pulmonary vascularity. 2.  Low lung volumes with mild  right base subsegmental atelectasis. Electronically Signed   By: Marcello Moores  Register   On: 03/06/2016 07:54   Dg Chest Port 1 View  Result Date: 03/05/2016 CLINICAL DATA:  Three day history of cough with right chest tightness, pain and congestion. EXAM: PORTABLE CHEST 1 VIEW COMPARISON:  None. FINDINGS: The lungs are adequately inflated with minimal patchy density right base likely atelectasis. Cardiomediastinal silhouette is within normal. There is minimal calcified plaque over the aortic arch. Surgical anchor over the left humeral head.  IMPRESSION: Minimal patchy right base density likely atelectasis. Electronically Signed   By: Marin Olp M.D.   On: 03/05/2016 10:36       Discharge Exam: Vitals:   03/06/16 1018 03/06/16 1449  BP: (!) 133/51 (!) 117/42  Pulse: 86 64  Resp:    Temp: (!) 101.4 F (38.6 C) 99.3 F (37.4 C)   Vitals:   03/06/16 0501 03/06/16 1018 03/06/16 1449 03/06/16 1526  BP: (!) 136/50 (!) 133/51 (!) 117/42   Pulse:  86 64   Resp:      Temp:  (!) 101.4 F (38.6 C) 99.3 F (37.4 C)   TempSrc:  Oral Oral   SpO2:  90% 93% 93%  Weight:      Height:        General: Pt is alert, awake, not in acute distress Cardiovascular: RRR, S1/S2 +, no rubs, no gallops Respiratory: CTA bilaterally, no wheezing, no rhonchi Abdominal: Soft, NT, ND, bowel sounds + Extremities: no edema, no cyanosis    The results of significant diagnostics from this hospitalization (including imaging, microbiology, ancillary and laboratory) are listed below for reference.     Microbiology: Recent Results (from the past 240 hour(s))  Blood Culture (routine x 2)     Status: None (Preliminary result)   Collection Time: 03/05/16 10:30 AM  Result Value Ref Range Status   Specimen Description BLOOD RIGHT ANTECUBITAL  Final   Special Requests   Final    BOTTLES DRAWN AEROBIC AND ANAEROBIC 10CC AER 5CC ANA   Culture NO GROWTH < 24 HOURS  Final   Report Status PENDING  Incomplete  Blood Culture (routine x 2)     Status: None (Preliminary result)   Collection Time: 03/05/16 10:50 AM  Result Value Ref Range Status   Specimen Description BLOOD LEFT ANTECUBITAL  Final   Special Requests BOTTLES DRAWN AEROBIC AND ANAEROBIC 5CC  Final   Culture NO GROWTH < 24 HOURS  Final   Report Status PENDING  Incomplete  Urine culture     Status: Abnormal   Collection Time: 03/05/16 12:42 PM  Result Value Ref Range Status   Specimen Description URINE, RANDOM  Final   Special Requests NONE  Final   Culture <10,000 COLONIES/mL  INSIGNIFICANT GROWTH (A)  Final   Report Status 03/06/2016 FINAL  Final  Respiratory Panel by PCR     Status: Abnormal   Collection Time: 03/06/16 12:32 PM  Result Value Ref Range Status   Adenovirus NOT DETECTED NOT DETECTED Final   Coronavirus 229E NOT DETECTED NOT DETECTED Final   Coronavirus HKU1 NOT DETECTED NOT DETECTED Final   Coronavirus NL63 NOT DETECTED NOT DETECTED Final   Coronavirus OC43 NOT DETECTED NOT DETECTED Final   Metapneumovirus NOT DETECTED NOT DETECTED Final   Rhinovirus / Enterovirus NOT DETECTED NOT DETECTED Final   Influenza A NOT DETECTED NOT DETECTED Final   Influenza B NOT DETECTED NOT DETECTED Final   Parainfluenza Virus 1 NOT DETECTED NOT DETECTED Final  Parainfluenza Virus 2 NOT DETECTED NOT DETECTED Final   Parainfluenza Virus 3 NOT DETECTED NOT DETECTED Final   Parainfluenza Virus 4 NOT DETECTED NOT DETECTED Final   Respiratory Syncytial Virus DETECTED (A) NOT DETECTED Final    Comment: CRITICAL RESULT CALLED TO, READ BACK BY AND VERIFIED WITH: Michaell Cowing AT 1621 03/06/16 BY L BENFIELD    Bordetella pertussis NOT DETECTED NOT DETECTED Final   Chlamydophila pneumoniae NOT DETECTED NOT DETECTED Final   Mycoplasma pneumoniae NOT DETECTED NOT DETECTED Final     Labs: BNP (last 3 results) No results for input(s): BNP in the last 8760 hours. Basic Metabolic Panel:  Recent Labs Lab 03/05/16 1034 03/06/16 0016  NA 139 136  K 4.0 3.6  CL 102 107  CO2 26 20*  GLUCOSE 133* 120*  BUN 15 11  CREATININE 1.12* 1.00  CALCIUM 9.2 7.6*   Liver Function Tests:  Recent Labs Lab 03/05/16 1034 03/06/16 0016  AST 25 24  ALT 17 14  ALKPHOS 58 39  BILITOT 0.9 0.6  PROT 8.0 6.0*  ALBUMIN 4.0 2.8*   No results for input(s): LIPASE, AMYLASE in the last 168 hours. No results for input(s): AMMONIA in the last 168 hours. CBC:  Recent Labs Lab 03/05/16 1034 03/06/16 0016  WBC 8.4 6.5  NEUTROABS 6.8  --   HGB 13.7 10.6*  HCT 40.5 32.0*  MCV  92.7 92.8  PLT 231 191   Cardiac Enzymes:  Recent Labs Lab 03/05/16 1759 03/06/16 0016  TROPONINI 0.05* 0.08*   BNP: Invalid input(s): POCBNP CBG: No results for input(s): GLUCAP in the last 168 hours. D-Dimer No results for input(s): DDIMER in the last 72 hours. Hgb A1c No results for input(s): HGBA1C in the last 72 hours. Lipid Profile No results for input(s): CHOL, HDL, LDLCALC, TRIG, CHOLHDL, LDLDIRECT in the last 72 hours. Thyroid function studies No results for input(s): TSH, T4TOTAL, T3FREE, THYROIDAB in the last 72 hours.  Invalid input(s): FREET3 Anemia work up No results for input(s): VITAMINB12, FOLATE, FERRITIN, TIBC, IRON, RETICCTPCT in the last 72 hours. Urinalysis    Component Value Date/Time   COLORURINE YELLOW 03/05/2016 1242   APPEARANCEUR HAZY (A) 03/05/2016 1242   LABSPEC 1.016 03/05/2016 1242   PHURINE 7.0 03/05/2016 1242   GLUCOSEU NEGATIVE 03/05/2016 1242   HGBUR NEGATIVE 03/05/2016 1242   BILIRUBINUR NEGATIVE 03/05/2016 1242   KETONESUR NEGATIVE 03/05/2016 1242   PROTEINUR NEGATIVE 03/05/2016 1242   NITRITE NEGATIVE 03/05/2016 1242   LEUKOCYTESUR NEGATIVE 03/05/2016 1242   Sepsis Labs Invalid input(s): PROCALCITONIN,  WBC,  LACTICIDVEN Microbiology Recent Results (from the past 240 hour(s))  Blood Culture (routine x 2)     Status: None (Preliminary result)   Collection Time: 03/05/16 10:30 AM  Result Value Ref Range Status   Specimen Description BLOOD RIGHT ANTECUBITAL  Final   Special Requests   Final    BOTTLES DRAWN AEROBIC AND ANAEROBIC 10CC AER 5CC ANA   Culture NO GROWTH < 24 HOURS  Final   Report Status PENDING  Incomplete  Blood Culture (routine x 2)     Status: None (Preliminary result)   Collection Time: 03/05/16 10:50 AM  Result Value Ref Range Status   Specimen Description BLOOD LEFT ANTECUBITAL  Final   Special Requests BOTTLES DRAWN AEROBIC AND ANAEROBIC 5CC  Final   Culture NO GROWTH < 24 HOURS  Final   Report Status  PENDING  Incomplete  Urine culture     Status: Abnormal   Collection  Time: 03/05/16 12:42 PM  Result Value Ref Range Status   Specimen Description URINE, RANDOM  Final   Special Requests NONE  Final   Culture <10,000 COLONIES/mL INSIGNIFICANT GROWTH (A)  Final   Report Status 03/06/2016 FINAL  Final  Respiratory Panel by PCR     Status: Abnormal   Collection Time: 03/06/16 12:32 PM  Result Value Ref Range Status   Adenovirus NOT DETECTED NOT DETECTED Final   Coronavirus 229E NOT DETECTED NOT DETECTED Final   Coronavirus HKU1 NOT DETECTED NOT DETECTED Final   Coronavirus NL63 NOT DETECTED NOT DETECTED Final   Coronavirus OC43 NOT DETECTED NOT DETECTED Final   Metapneumovirus NOT DETECTED NOT DETECTED Final   Rhinovirus / Enterovirus NOT DETECTED NOT DETECTED Final   Influenza A NOT DETECTED NOT DETECTED Final   Influenza B NOT DETECTED NOT DETECTED Final   Parainfluenza Virus 1 NOT DETECTED NOT DETECTED Final   Parainfluenza Virus 2 NOT DETECTED NOT DETECTED Final   Parainfluenza Virus 3 NOT DETECTED NOT DETECTED Final   Parainfluenza Virus 4 NOT DETECTED NOT DETECTED Final   Respiratory Syncytial Virus DETECTED (A) NOT DETECTED Final    Comment: CRITICAL RESULT CALLED TO, READ BACK BY AND VERIFIED WITH: Michaell Cowing AT 1621 03/06/16 BY L BENFIELD    Bordetella pertussis NOT DETECTED NOT DETECTED Final   Chlamydophila pneumoniae NOT DETECTED NOT DETECTED Final   Mycoplasma pneumoniae NOT DETECTED NOT DETECTED Final     Time coordinating discharge: Over 30 minutes  SIGNED:   Debbe Odea, MD  [facility] 03/06/2016, 5:00 PM Pager   If 7PM-7AM, please contact night-coverage [email or web address] Password [email or web address]

## 2016-03-07 DIAGNOSIS — J210 Acute bronchiolitis due to respiratory syncytial virus: Principal | ICD-10-CM

## 2016-03-07 LAB — PROCALCITONIN: Procalcitonin: <0.1 ng/mL

## 2016-03-07 LAB — LEGIONELLA PNEUMOPHILA SEROGP 1 UR AG: L. PNEUMOPHILA SEROGP 1 UR AG: NEGATIVE

## 2016-03-07 NOTE — Progress Notes (Signed)
Remains stable, no changes from DC summary 12/26

## 2016-03-10 LAB — CULTURE, BLOOD (ROUTINE X 2)
CULTURE: NO GROWTH
Culture: NO GROWTH

## 2017-06-06 IMAGING — DX DG CHEST 2V
2 series · 2 of 2 positions shown · non-contrast
Comparison: 03/05/2016.

CLINICAL DATA: Cough.

EXAM:
CHEST  2 VIEW

[chest lat]
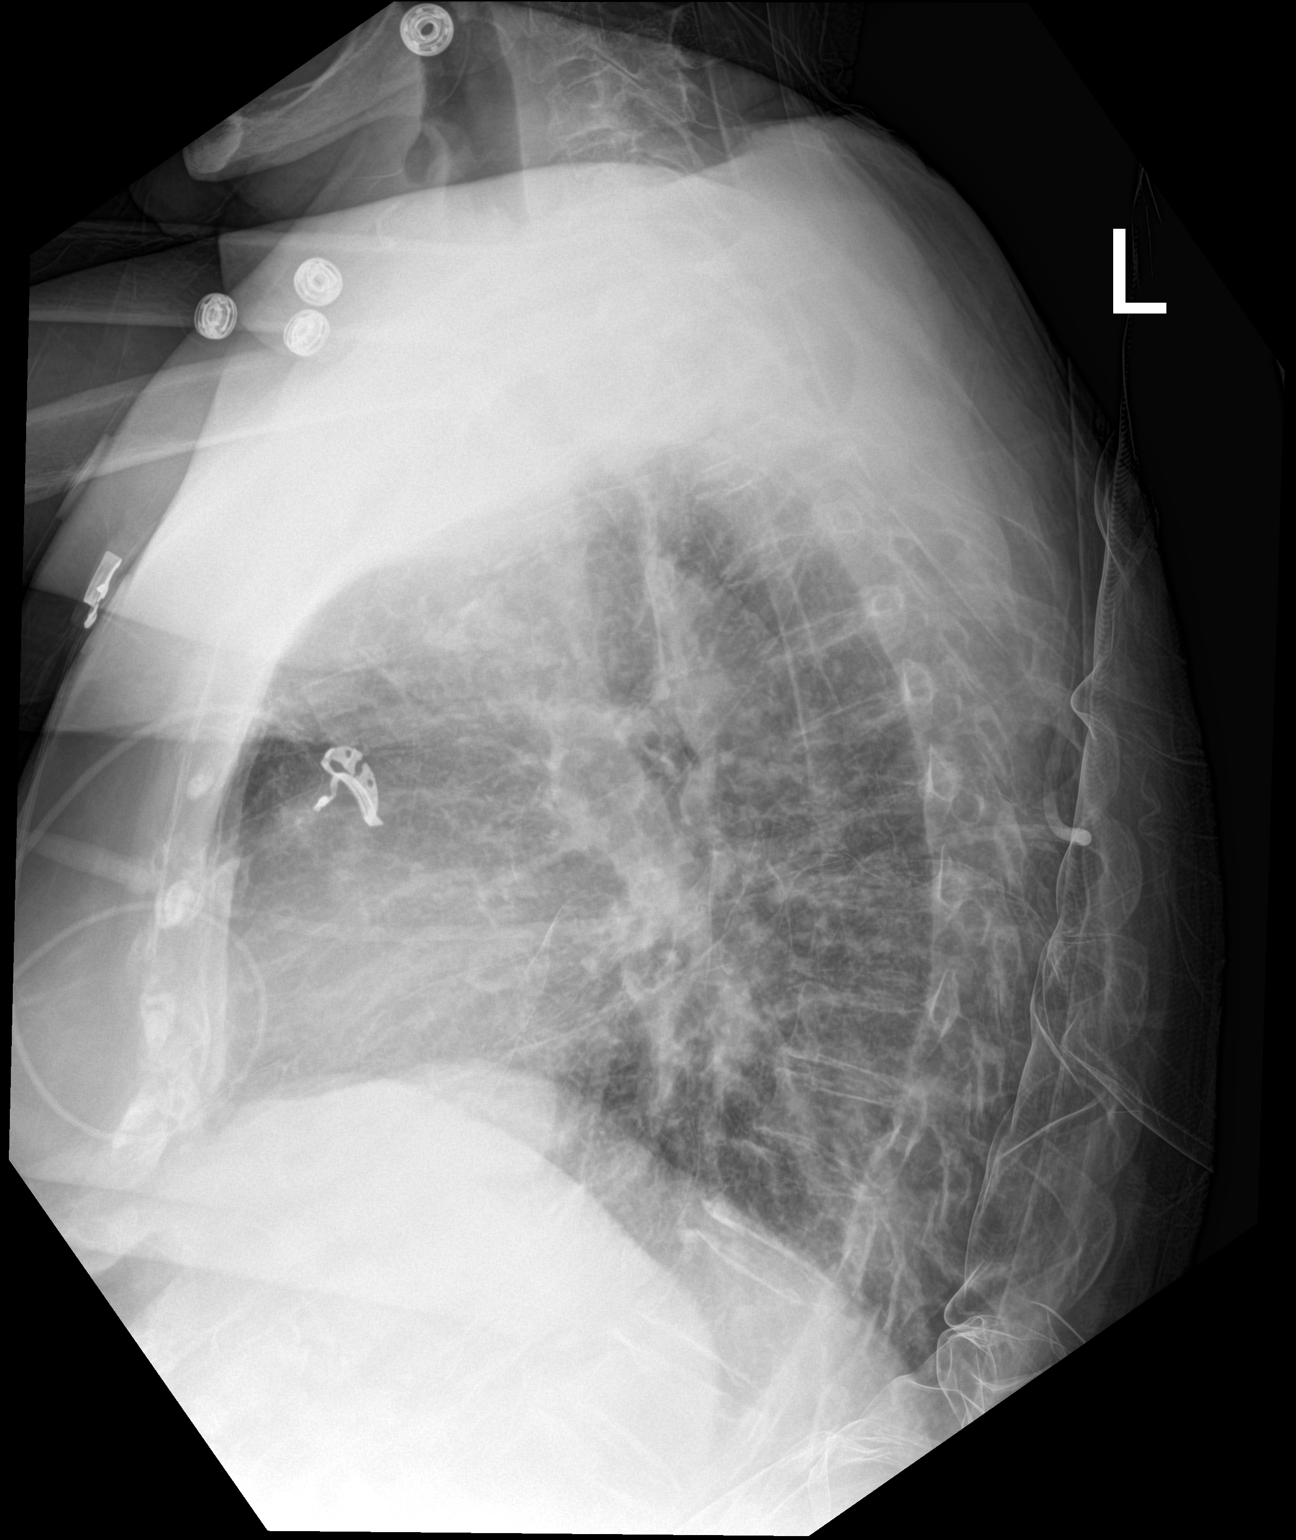

[chest ap]
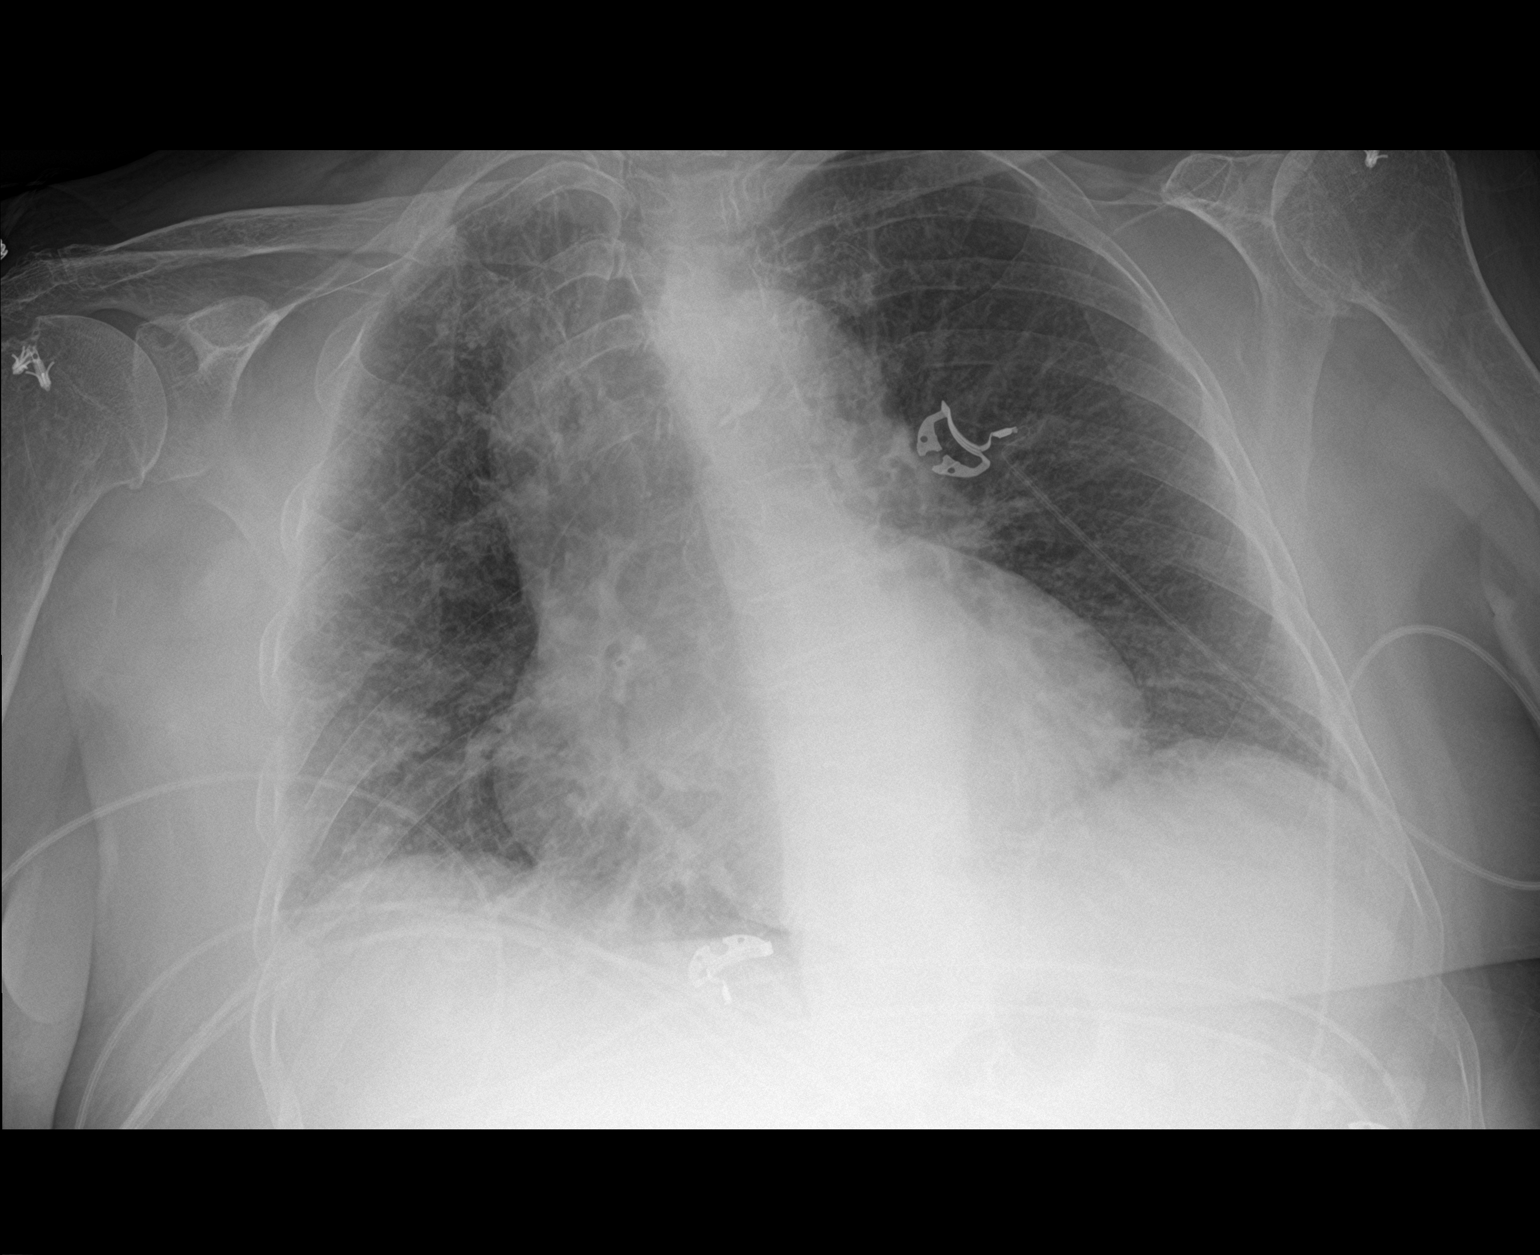

[2 of 2 positions shown; findings below may reference images not displayed]

FINDINGS: Mediastinum and hilar structures are normal. Cardiomegaly with
normal pulmonary vascularity. Right base subsegmental atelectasis.
No pleural effusion or pneumothorax .
IMPRESSION: 1. Cardiomegaly with normal pulmonary vascularity.

2.  Low lung volumes with mild right base subsegmental atelectasis.

## 2018-09-10 DEATH — deceased
